# Patient Record
Sex: Male | Born: 1990 | Race: White | Hispanic: No | Marital: Single
Health system: Southern US, Community
[De-identification: ages and names within clinical notes are randomized; demographics above are authoritative.]

---

## 2015-12-22 ENCOUNTER — Emergency Department (INDEPENDENT_AMBULATORY_CARE_PROVIDER_SITE_OTHER)
Admission: EM | Admit: 2015-12-22 | Discharge: 2015-12-22 | Disposition: A | Discharge status: Home / Self Care | Payer: Federal, State, Local not specified - PPO | Source: Home / Self Care | Attending: Emergency Medicine | Admitting: Emergency Medicine

## 2015-12-22 ENCOUNTER — Emergency Department (INDEPENDENT_AMBULATORY_CARE_PROVIDER_SITE_OTHER): Payer: Federal, State, Local not specified - PPO

## 2015-12-22 ENCOUNTER — Encounter (HOSPITAL_COMMUNITY): Payer: Self-pay | Admitting: Emergency Medicine

## 2015-12-22 DIAGNOSIS — J189 Pneumonia, unspecified organism: Secondary | ICD-10-CM

## 2015-12-22 MED ORDER — HYDROCODONE-HOMATROPINE 5-1.5 MG/5ML PO SYRP: 5.0000 mL | ORAL_SOLUTION | Freq: Four times a day (QID) | ORAL | Status: AC | PRN

## 2015-12-22 MED ORDER — LEVOFLOXACIN 500 MG PO TABS: 500.0000 mg | ORAL_TABLET | Freq: Every day | ORAL | Status: AC

## 2015-12-22 NOTE — Discharge Instructions (Signed)
You have pneumonia. Please stop the amoxicillin and start Levaquin. Use the Hycodan every 4-6 hours as needed for cough. Do not drive while taking this medicine. You can take Tylenol or ibuprofen to help with fevers. You should start to feel better within 48 hours of starting the antibiotic. You will need a repeat chest x-ray in 4-6 weeks to make sure the pneumonia has resolved.

## 2015-12-22 NOTE — ED Provider Notes (Signed)
CSN: [id number]     Arrival date & time 12/22/15  1704 History   First MD Initiated Contact with Patient 12/22/15 1801     Chief Complaint  Patient presents with  . URI   (Consider location/radiation/quality/duration/timing/severity/associated sxs/prior Treatment) HPI  He is a 25 year old man here for evaluation of cough. He states he developed cold symptoms about 10 days ago with nasal congestion, sore throat, and cough. 5 days ago, he developed fevers, chills, body aches, worsening cough and shortness of breath. He was seen at an urgent care in [place] where they did a flu swab that was negative. They treated him with amoxicillin. He states he did seem to get better for a few days, but states that he continued to have low-grade fevers between 100 and 101.  This morning, he reports recurrence of fevers, chills, body aches. He also reports worsening cough and feeling short of breath. No wheezing. He does report some chest discomfort with coughing and deep breaths. He continues to have nasal congestion. He states his throat feels irritated from all the coughing.  History reviewed. No pertinent past medical history. History reviewed. No pertinent past surgical history. No family history on file. Social History  Substance Use Topics  . Smoking status: None  . Smokeless tobacco: None  . Alcohol Use: None    Review of Systems As in history of present illness Allergies  Review of patient's allergies indicates not on file.  Home Medications   Prior to Admission medications   Medication Sig Start Date End Date Taking? Authorizing Provider  HYDROcodone-homatropine (HYCODAN) 5-1.5 MG/5ML syrup Take 5 mLs by mouth every 6 (six) hours as needed for cough. 12/22/15   Charm Rings, MD  levofloxacin (LEVAQUIN) 500 MG tablet Take 1 tablet (500 mg total) by mouth daily. 12/22/15   Charm Rings, MD   Meds Ordered and Administered this Visit  Medications - No data to display  BP 115/71 mmHg  Pulse  65  Temp(Src) 99.8 F (37.7 C) (Oral)  Resp 17  SpO2 100% No data found.   Physical Exam  Constitutional: He is oriented to person, place, and time. He appears well-developed and well-nourished.  HENT:  Mouth/Throat: No oropharyngeal exudate.  Streaky erythema in posterior pharynx. Nasal mucosa is erythematous with discharge present.  Neck: Neck supple.  Cardiovascular: Normal rate and normal heart sounds.   No murmur heard. Pulmonary/Chest: Effort normal and breath sounds normal. No respiratory distress. He has no wheezes. He has no rales.  Lymphadenopathy:    He has no cervical adenopathy.  Neurological: He is alert and oriented to person, place, and time.    ED Course  Procedures (including critical care time)  Labs Review Labs Reviewed - No data to display  Imaging Review Dg Chest 2 View  12/22/2015  CLINICAL DATA:  Patient with cough for 1 week. Pain with deep inspiration. EXAM: CHEST  2 VIEW COMPARISON:  None. FINDINGS: Normal cardiac and mediastinal contours. Consolidative opacities demonstrated posteriorly on the lateral view as well as within the left lower lung on the frontal view. No pleural effusion or pneumothorax. Regional skeleton is unremarkable. IMPRESSION: Focal consolidation demonstrated posteriorly on the lateral view concerning for pneumonia in the appropriate clinical setting. Followup PA and lateral chest X-ray is recommended in 3-4 weeks following trial of antibiotic therapy to ensure resolution and exclude underlying malignancy. Electronically Signed   By: Annia Belt M.D.   On: 12/22/2015 19:09     MDM   1.  CAP (community acquired pneumonia)    X-ray consistent with pneumonia.  We'll change antibiotic to Levaquin. Hycodan as needed for cough. Will need follow-up at home in 4-6 weeks to ensure resolution.    Charm Rings, MD 12/22/15 [phone]

## 2015-12-22 NOTE — ED Notes (Signed)
C/o cold sx onset 1.5 weeks associated w/cough, chills, BA, fevers, SOB Reports he went to ER in [place] on Wednesday ... Was Rx Amox and cough syrup States sx flared up this am A&O x4... No acute distress.

## 2017-09-14 IMAGING — DX DG CHEST 2V
2 series · 2 of 2 positions shown · non-contrast
Comparison: None.

CLINICAL DATA: Patient with cough for 1 week. Pain with deep
inspiration.

EXAM:
CHEST  2 VIEW

[chest pa]
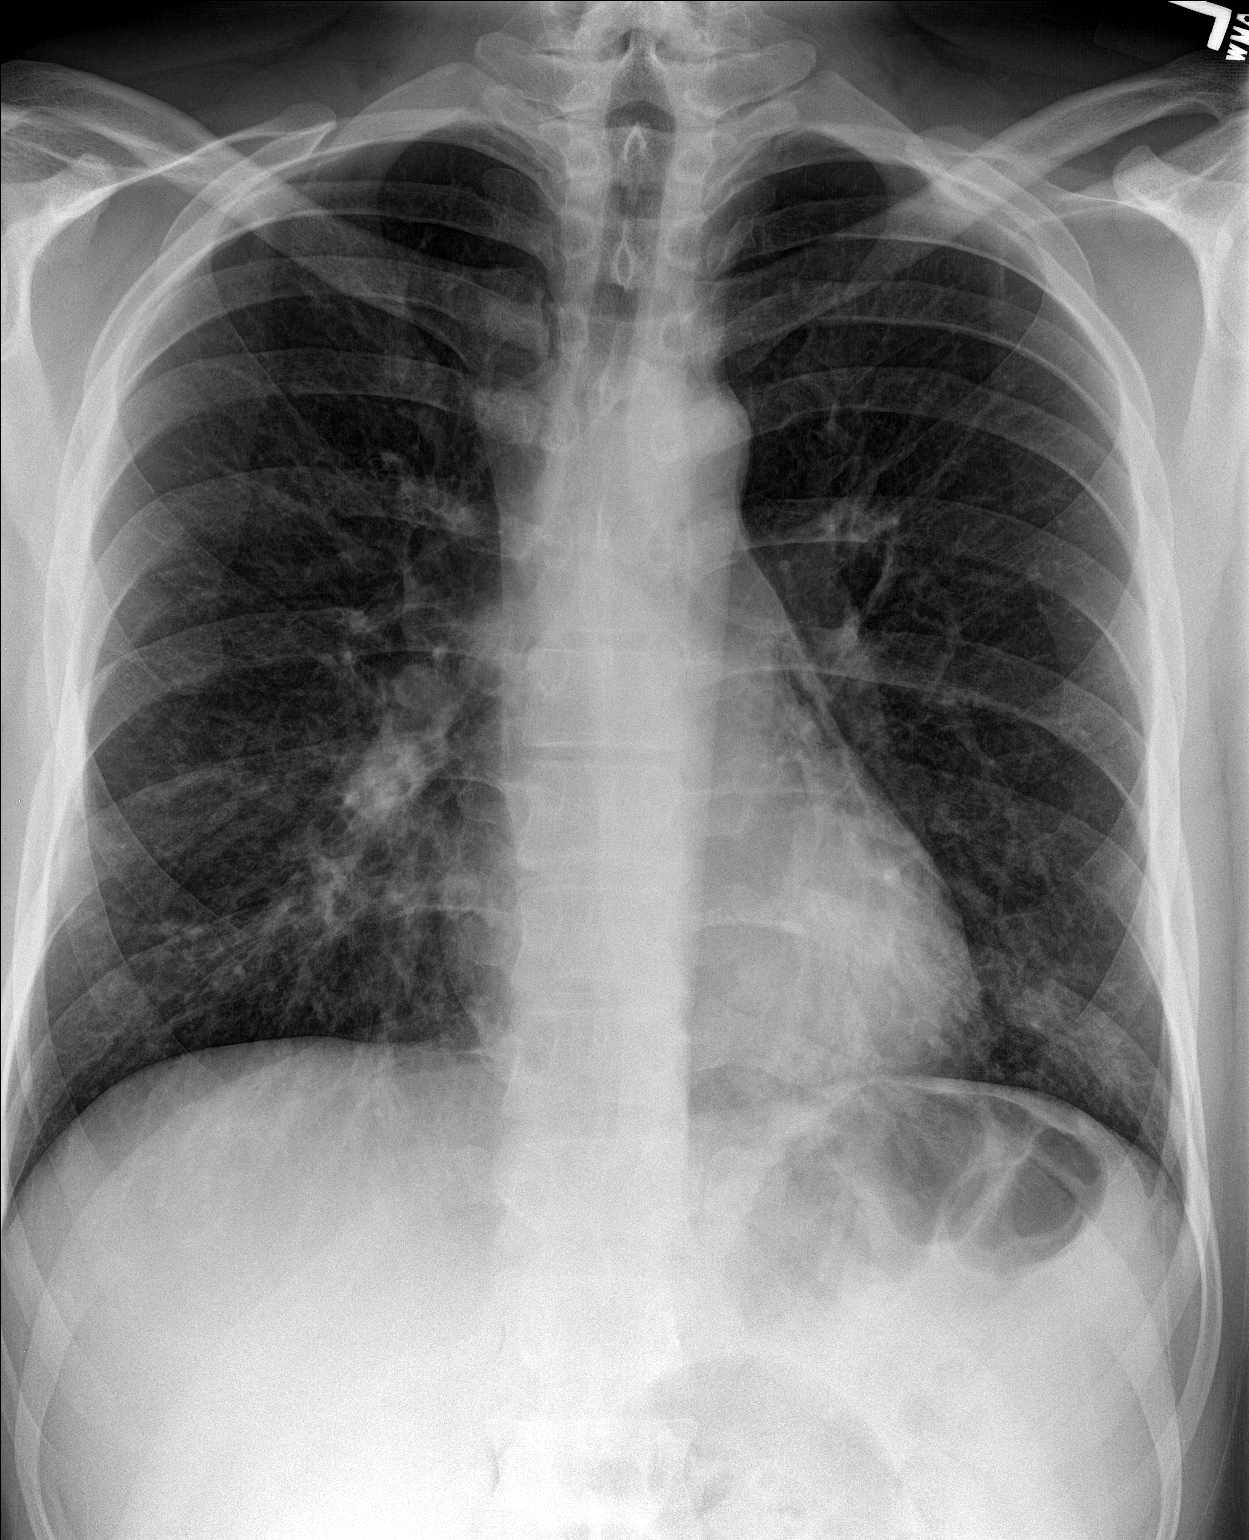

[chest lat]
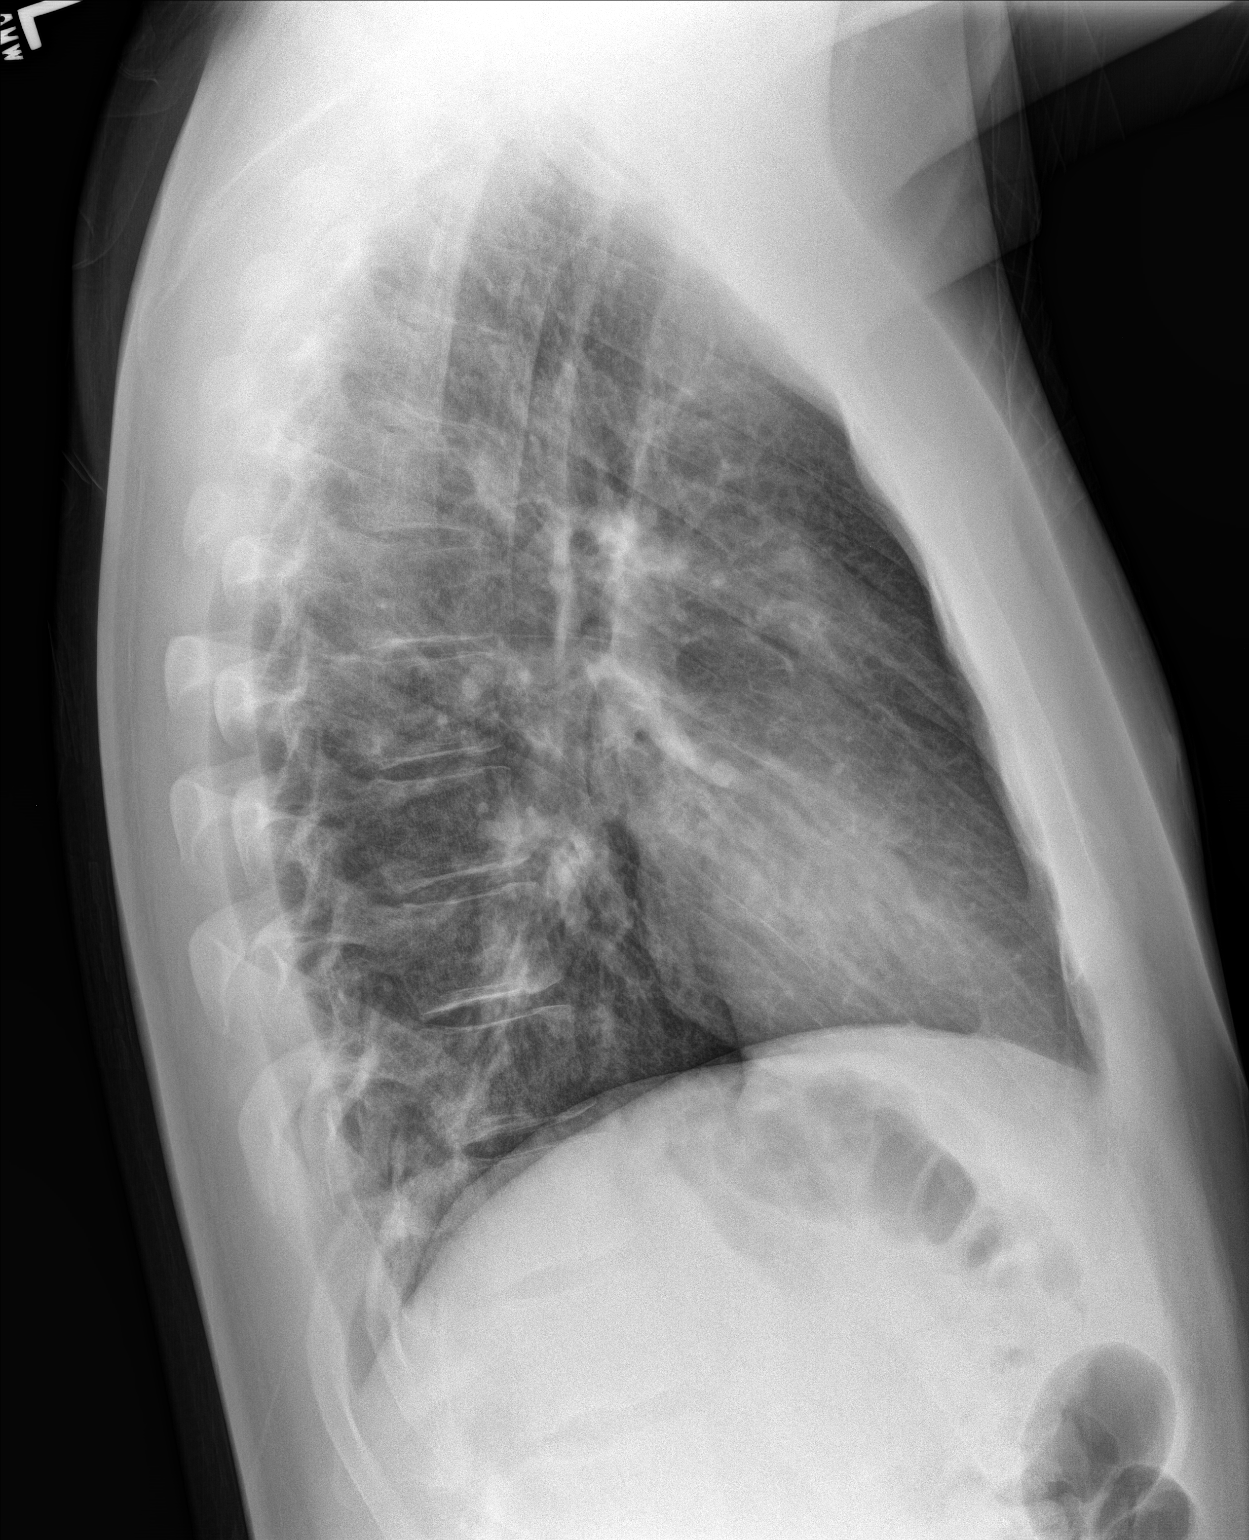

[2 of 2 positions shown; findings below may reference images not displayed]

FINDINGS: Normal cardiac and mediastinal contours. Consolidative opacities
demonstrated posteriorly on the lateral view as well as within the
left lower lung on the frontal view. No pleural effusion or
pneumothorax. Regional skeleton is unremarkable.
IMPRESSION: Focal consolidation demonstrated posteriorly on the lateral view
concerning for pneumonia in the appropriate clinical setting.
Followup PA and lateral chest X-ray is recommended in 3-4 weeks
following trial of antibiotic therapy to ensure resolution and
exclude underlying malignancy.
